# Patient Record
Sex: Female | Born: 1990 | Race: Black or African American | Hispanic: No | Marital: Single | Smoking: Never smoker
Health system: Southern US, Community
[De-identification: ages and names within clinical notes are randomized; demographics above are authoritative.]

## PROBLEM LIST (undated history)

## (undated) DIAGNOSIS — K219 Gastro-esophageal reflux disease without esophagitis: Secondary | ICD-10-CM

## (undated) HISTORY — DX: Gastro-esophageal reflux disease without esophagitis: K21.9

## (undated) HISTORY — PX: TONSILLECTOMY: SUR1361

---

## 2012-02-07 ENCOUNTER — Emergency Department (HOSPITAL_COMMUNITY)
Admission: EM | Admit: 2012-02-07 | Discharge: 2012-02-07 | Disposition: A | Discharge status: Home / Self Care | Payer: Medicaid Other | Attending: Emergency Medicine | Admitting: Emergency Medicine

## 2012-02-07 ENCOUNTER — Encounter (HOSPITAL_COMMUNITY): Payer: Self-pay

## 2012-02-07 ENCOUNTER — Emergency Department (HOSPITAL_COMMUNITY): Payer: Medicaid Other

## 2012-02-07 DIAGNOSIS — N760 Acute vaginitis: Secondary | ICD-10-CM | POA: Insufficient documentation

## 2012-02-07 DIAGNOSIS — A499 Bacterial infection, unspecified: Secondary | ICD-10-CM | POA: Insufficient documentation

## 2012-02-07 DIAGNOSIS — O2390 Unspecified genitourinary tract infection in pregnancy, unspecified trimester: Secondary | ICD-10-CM | POA: Insufficient documentation

## 2012-02-07 DIAGNOSIS — O99891 Other specified diseases and conditions complicating pregnancy: Secondary | ICD-10-CM | POA: Insufficient documentation

## 2012-02-07 DIAGNOSIS — R109 Unspecified abdominal pain: Secondary | ICD-10-CM | POA: Insufficient documentation

## 2012-02-07 DIAGNOSIS — B9689 Other specified bacterial agents as the cause of diseases classified elsewhere: Secondary | ICD-10-CM | POA: Insufficient documentation

## 2012-02-07 LAB — CBC WITH DIFFERENTIAL/PLATELET
Basophils Absolute: 0 10*3/uL (ref 0.0–0.1)
Eosinophils Absolute: 0.1 10*3/uL (ref 0.0–0.7)
Eosinophils Relative: 1 % (ref 0–5)
Lymphocytes Relative: 30 % (ref 12–46)
MCH: 23.8 pg — ABNORMAL LOW (ref 26.0–34.0)
MCV: 71 fL — ABNORMAL LOW (ref 78.0–100.0)
Platelets: 324 10*3/uL (ref 150–400)
RDW: 14.3 % (ref 11.5–15.5)
WBC: 6.7 10*3/uL (ref 4.0–10.5)

## 2012-02-07 LAB — COMPREHENSIVE METABOLIC PANEL
ALT: 38 U/L — ABNORMAL HIGH (ref 0–35)
AST: 24 U/L (ref 0–37)
Albumin: 3.5 g/dL (ref 3.5–5.2)
Calcium: 9.5 mg/dL (ref 8.4–10.5)
Sodium: 135 mEq/L (ref 135–145)
Total Protein: 6.8 g/dL (ref 6.0–8.3)

## 2012-02-07 LAB — URINE MICROSCOPIC-ADD ON

## 2012-02-07 LAB — URINALYSIS, ROUTINE W REFLEX MICROSCOPIC
Bilirubin Urine: NEGATIVE
Specific Gravity, Urine: 1.015 (ref 1.005–1.030)
pH: 7 (ref 5.0–8.0)

## 2012-02-07 LAB — WET PREP, GENITAL

## 2012-02-07 LAB — HCG, QUANTITATIVE, PREGNANCY: hCG, Beta Chain, Quant, S: 44205 m[IU]/mL — ABNORMAL HIGH (ref <5)

## 2012-02-07 MED ORDER — METRONIDAZOLE 0.75 % VA GEL: 1.0000 | Freq: Two times a day (BID) | VAGINAL | Status: AC

## 2012-02-07 NOTE — ED Notes (Signed)
Pt reports lower quad ab pain that started last night, last normal period was July. (only lasted 2 days), white vaginal discharge at times. Denies any urinary s/s

## 2012-02-07 NOTE — ED Notes (Signed)
Pelvic cart set up, awaiting pa.

## 2012-02-07 NOTE — ED Provider Notes (Signed)
History     CSN: [id number]  Arrival date & time 02/07/12  1026   First MD Initiated Contact with Patient 02/07/12 1035      Chief Complaint  Patient presents with  . Abdominal Pain    (Consider location/radiation/quality/duration/timing/severity/associated sxs/prior treatment) HPI Comments: patient c/o lower abdominal pain that began on the evening prior to ED arrival.  Describes the pain as "crampy" onset was gradual but states it has persisted.  She also c/o "whitish" vaginal discharge for several days but states it appears similar to her usual vaginal discharge.  States that her last menstrual period was early July and was shorter duration than her usual periods.  She denies change in appetite, fever, vomiting, urinary symptoms or vaginal bleeding.  States she has not taken a home pregnancy test.  Denies previous pregnancies, miscarriages or abortions  Patient is a 21 y.o. female presenting with abdominal pain. The history is provided by the patient.  Abdominal Pain The primary symptoms of the illness include abdominal pain and vaginal discharge. The primary symptoms of the illness do not include fever, fatigue, shortness of breath, nausea, vomiting, diarrhea, hematemesis, hematochezia, dysuria or vaginal bleeding. The current episode started yesterday. The onset of the illness was gradual. The problem has not changed since onset. The abdominal pain began yesterday. The pain came on gradually. The abdominal pain has been unchanged since its onset. The abdominal pain is located in the LLQ and RLQ. The abdominal pain does not radiate. The abdominal pain is relieved by nothing.  The vaginal discharge is not associated with dysuria.   Pregnant now: possibly. The patient has not had a change in bowel habit. Symptoms associated with the illness do not include chills, anorexia, diaphoresis, heartburn, constipation, urgency, hematuria, frequency or back pain. Significant associated medical issues  do not include PUD, GERD, inflammatory bowel disease, diabetes, gallstones or substance abuse.    History reviewed. No pertinent past medical history.  History reviewed. No pertinent past surgical history.  No family history on file.  History  Substance Use Topics  . Smoking status: Never Smoker   . Smokeless tobacco: Not on file  . Alcohol Use: No    OB History    Grav Para Term Preterm Abortions TAB SAB Ect Mult Living                  Review of Systems  Constitutional: Negative for fever, chills, diaphoresis, appetite change and fatigue.  Respiratory: Negative for chest tightness and shortness of breath.   Cardiovascular: Negative for chest pain.  Gastrointestinal: Positive for abdominal pain. Negative for heartburn, nausea, vomiting, diarrhea, constipation, blood in stool, hematochezia, abdominal distention, anorexia and hematemesis.  Genitourinary: Positive for vaginal discharge, menstrual problem and pelvic pain. Negative for dysuria, urgency, frequency, hematuria, flank pain, vaginal bleeding and difficulty urinating.  Musculoskeletal: Negative for back pain.  Skin: Negative for rash.  Neurological: Negative for dizziness and headaches.  All other systems reviewed and are negative.    Allergies  Review of patient's allergies indicates no known allergies.  Home Medications  No current outpatient prescriptions on file.  BP 102/70  Pulse 97  Temp 98.9 F (37.2 C) (Oral)  Resp 20  Ht 5\' 2"  (1.575 m)  Wt 105 lb (47.628 kg)  BMI 19.20 kg/m2  SpO2 100%  LMP 12/09/2011  Physical Exam  Nursing note and vitals reviewed. Constitutional: She is oriented to person, place, and time. She appears well-developed and well-nourished. No distress.  HENT:  Head:  Normocephalic and atraumatic.  Mouth/Throat: Oropharynx is clear and moist.  Cardiovascular: Normal rate, regular rhythm, normal heart sounds and intact distal pulses.   No murmur heard. Pulmonary/Chest: Effort  normal and breath sounds normal.  Abdominal: Soft. Bowel sounds are normal. She exhibits no distension and no mass. There is tenderness in the suprapubic area. There is no rebound, no guarding, no CVA tenderness and no tenderness at McBurney's point.         Mild ttp of the suprapubic, LLQ and RLQ areas.  No guarding or rebound tenderness, no flank tenderness  Genitourinary: Uterus is enlarged. Uterus is not tender. Cervix exhibits no motion tenderness, no discharge and no friability. Right adnexum displays no mass, no tenderness and no fullness. Left adnexum displays no mass, no tenderness and no fullness. No tenderness or bleeding around the vagina. No foreign body around the vagina. Vaginal discharge found.       Thick, white discharge in the vaginal vault.  No bleeding.  Cervical os is closed.  Uterus is mildly enlarged.  Musculoskeletal: She exhibits no edema.  Neurological: She is alert and oriented to person, place, and time. She exhibits normal muscle tone. Coordination normal.  Skin: Skin is warm and dry.    ED Course  Procedures (including critical care time)  Labs Reviewed  CBC WITH DIFFERENTIAL - Abnormal; Notable for the following:    Hemoglobin 11.4 (*)     HCT 34.1 (*)     MCV 71.0 (*)     MCH 23.8 (*)     All other components within normal limits  COMPREHENSIVE METABOLIC PANEL - Abnormal; Notable for the following:    Glucose, Bld 103 (*)     BUN 5 (*)     ALT 38 (*)     Total Bilirubin 0.2 (*)     All other components within normal limits  URINALYSIS, ROUTINE W REFLEX MICROSCOPIC - Abnormal; Notable for the following:    Hgb urine dipstick TRACE (*)     All other components within normal limits  PREGNANCY, URINE - Abnormal; Notable for the following:    Preg Test, Ur POSITIVE (*)     All other components within normal limits  URINE MICROSCOPIC-ADD ON - Abnormal; Notable for the following:    Squamous Epithelial / LPF FEW (*)     Bacteria, UA MANY (*)     All  other components within normal limits  HCG, QUANTITATIVE, PREGNANCY - Abnormal; Notable for the following:    hCG, Beta Chain, Quant, S [id number] (*)     All other components within normal limits  WET PREP, GENITAL - Abnormal; Notable for the following:    Clue Cells Wet Prep HPF POC MANY (*)     WBC, Wet Prep HPF POC MANY (*)     All other components within normal limits  ABO/RH  GC/CHLAMYDIA PROBE AMP, GENITAL    US Ob Comp Less 14 Wks  02/07/2012  *RADIOLOGY REPORT*  Clinical Data: Abdominal pain with positive pregnancy test.  LMP 12/09/2011  OBSTETRIC <14 WK [place] AND TRANSVAGINAL OB US  Technique:  Both transabdominal and transvaginal ultrasound examinations were performed for complete evaluation of the gestation as well as the maternal uterus, adnexal regions, and pelvic cul-de-sac.  Transvaginal technique was performed to assess early pregnancy.  Comparison:  None.  Intrauterine gestational sac:  Visualized/normal in shape. Yolk sac: Visualized and appears normal with a diameter of 5.9 mm Embryo: Seen Cardiac Activity: Seen Heart Rate: 168 bpm  MSD: 2.68 mm  7 w 4 d CRL: 1.09  mm  7 w  1 d             [place] EDC: 09/25/2011  Maternal uterus/adnexae: The left ovary has a normal appearance measuring 2.8 x 1.1 x 2.0 cm.  The right ovary measures 3.5 x 2.8 x 3.3 cm and contains a complex cystic lesion measuring 2.5 x 2.1 x 2.6 cm.  This contains some diffuse low level echoes and some avascular thin septations.  Color Doppler reveals some peripheral flow around this area and this likely represents a hemorrhagic corpus luteum with endometrioma a secondary consideration.  A small amount of simple free fluid is identified adjacent to the right ovary  IMPRESSION: Single living intrauterine pregnancy demonstrating an estimated gestational age by crown-rump length of 7 weeks 1 day with corresponding EDC of 09/25/2011.  This is 1 week 3 days behind expected estimated gestational age by LMP of 8 weeks 4 days and  suggests best dating by today's exam.  Probable right hemorrhagic corpus luteum with endometrioma a secondary consideration.  This can be initially reassessed at the time of anatomy exam.  We would expect a hemorrhagic corpus luteum to demonstrate interval resolution/evolution while an endometrioma would likely show little interval change.   Original Report Authenticated By: Bertha Stakes, M.D.    US Ob Comp Less 14 Wks  02/07/2012  *RADIOLOGY REPORT*  Clinical Data: Abdominal pain with positive pregnancy test.  LMP 12/09/2011  OBSTETRIC <14 WK [place] AND TRANSVAGINAL OB US  Technique:  Both transabdominal and transvaginal ultrasound examinations were performed for complete evaluation of the gestation as well as the maternal uterus, adnexal regions, and pelvic cul-de-sac.  Transvaginal technique was performed to assess early pregnancy.  Comparison:  None.  Intrauterine gestational sac:  Visualized/normal in shape. Yolk sac: Visualized and appears normal with a diameter of 5.9 mm Embryo: Seen Cardiac Activity: Seen Heart Rate: 168 bpm  MSD: 2.68 mm  7 w 4 d CRL: 1.09  mm  7 w  1 d             [place] EDC: 09/25/2011  Maternal uterus/adnexae: The left ovary has a normal appearance measuring 2.8 x 1.1 x 2.0 cm.  The right ovary measures 3.5 x 2.8 x 3.3 cm and contains a complex cystic lesion measuring 2.5 x 2.1 x 2.6 cm.  This contains some diffuse low level echoes and some avascular thin septations.  Color Doppler reveals some peripheral flow around this area and this likely represents a hemorrhagic corpus luteum with endometrioma a secondary consideration.  A small amount of simple free fluid is identified adjacent to the right ovary  IMPRESSION: Single living intrauterine pregnancy demonstrating an estimated gestational age by crown-rump length of 7 weeks 1 day with corresponding EDC of 09/25/2011.  This is 1 week 3 days behind expected estimated gestational age by LMP of 8 weeks 4 days and suggests best dating by  today's exam.  Probable right hemorrhagic corpus luteum with endometrioma a secondary consideration.  This can be initially reassessed at the time of anatomy exam.  We would expect a hemorrhagic corpus luteum to demonstrate interval resolution/evolution while an endometrioma would likely show little interval change.   Original Report Authenticated By: Bertha Stakes, M.D.      Gc and chlamydia culture pending   MDM     Patient is well appearing, vitals stable.  abd is soft, no guarding or rebound tenderness.  Pt  agrees to close f/u with Dr. Emelda Fear.  Pt states she has severe vomiting with flagyl, so I will prescribe the metro-gel instead.     The patient appears reasonably screened and/or stabilized for discharge and I doubt any other medical condition or other [facility] requiring further screening, evaluation, or treatment in the ED at this time prior to discharge.       David Towson L. [place], [place] 02/07/12 1417

## 2012-02-08 LAB — GC/CHLAMYDIA PROBE AMP, GENITAL
Chlamydia, DNA Probe: NEGATIVE
GC Probe Amp, Genital: NEGATIVE

## 2012-02-10 NOTE — ED Provider Notes (Signed)
Medical screening examination/treatment/procedure(s) were performed by non-physician practitioner and as supervising physician I was immediately available for consultation/collaboration.   Shelda Jakes, MD 02/10/12 [phone]

## 2014-01-02 IMAGING — US US OB COMP LESS 14 WK
1 series · 13 of 28 positions shown · non-contrast
Comparison: None.

CLINICAL DATA: Abdominal pain with positive pregnancy test.  LMP
12/09/2011

OBSTETRIC <14 WK US AND TRANSVAGINAL OB US
TECHNIQUE: Both transabdominal and transvaginal ultrasound
examinations were performed for complete evaluation of the
gestation as well as the maternal uterus, adnexal regions, and
pelvic cul-de-sac.  Transvaginal technique was performed to assess
early pregnancy.

[Series 1: us ob comp less 14 wk · 0.11mm/px · 13 of 51 slices shown]
[im 2/51]
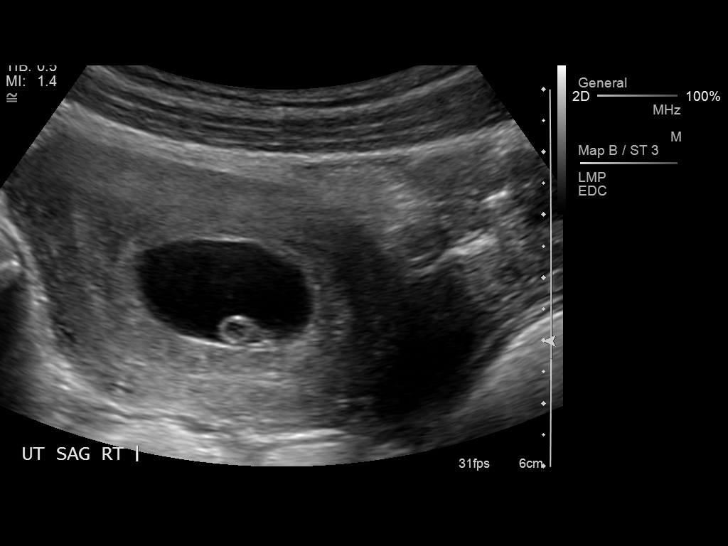
[im 6/51]
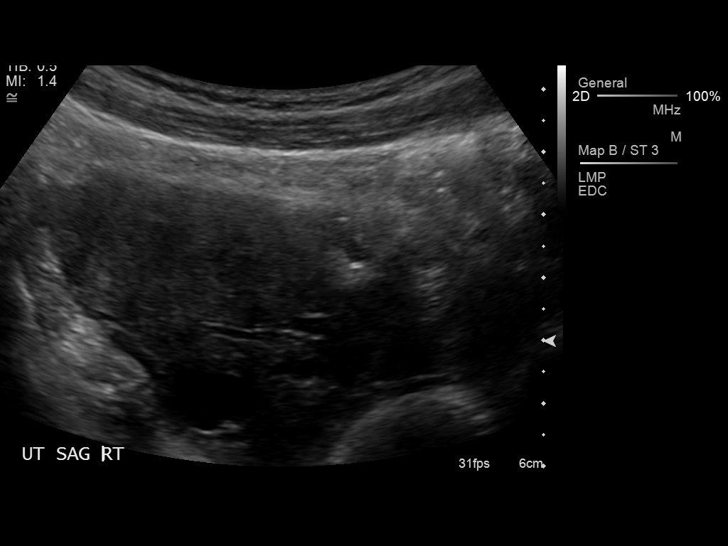
[im 10/51]
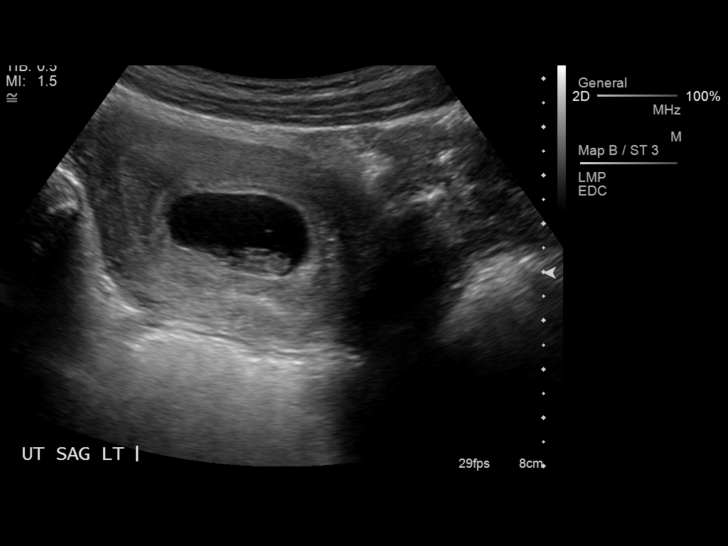
[im 13/51]
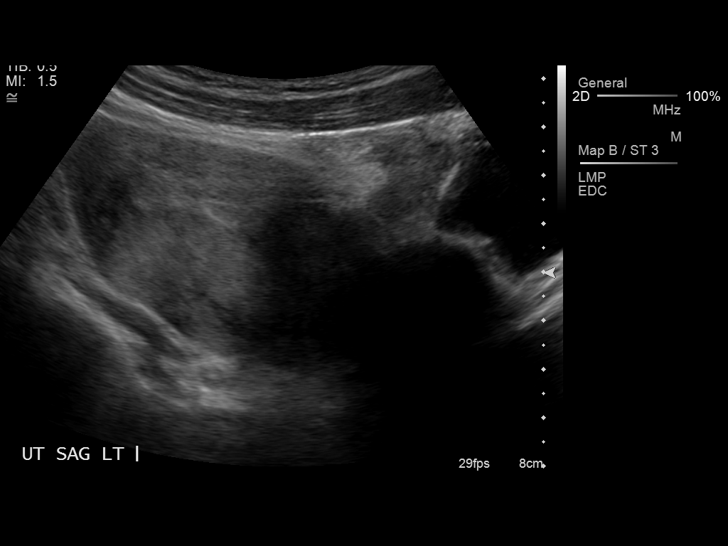
[im 17/51]
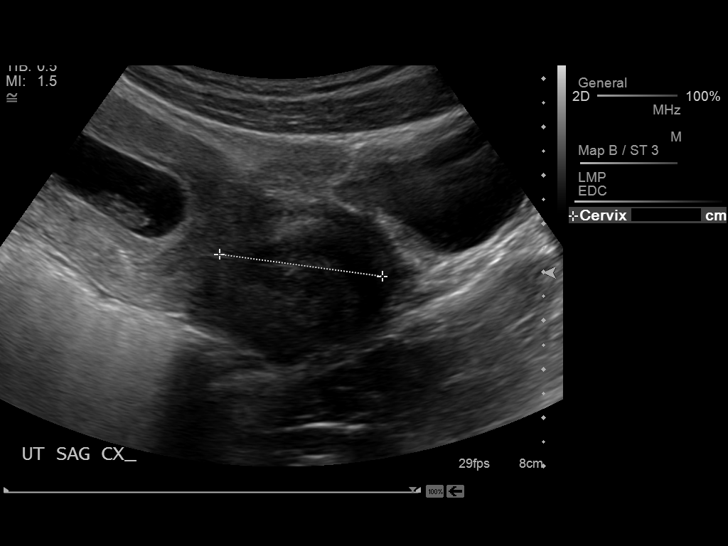
[im 21/51]
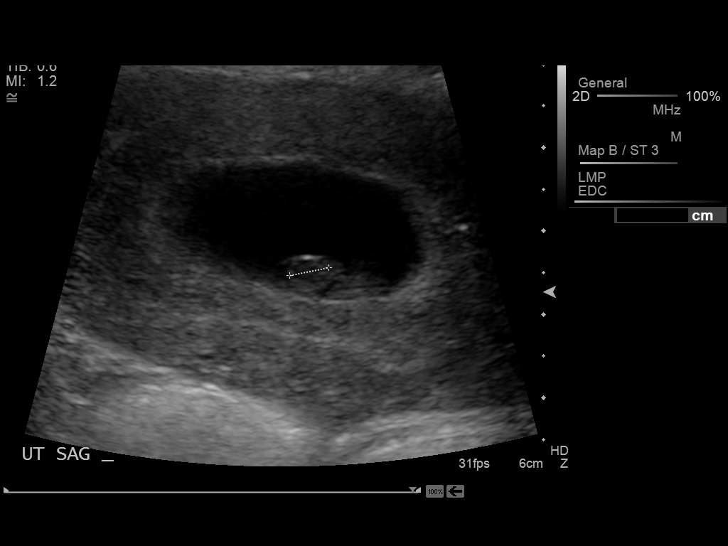
[im 26/51]
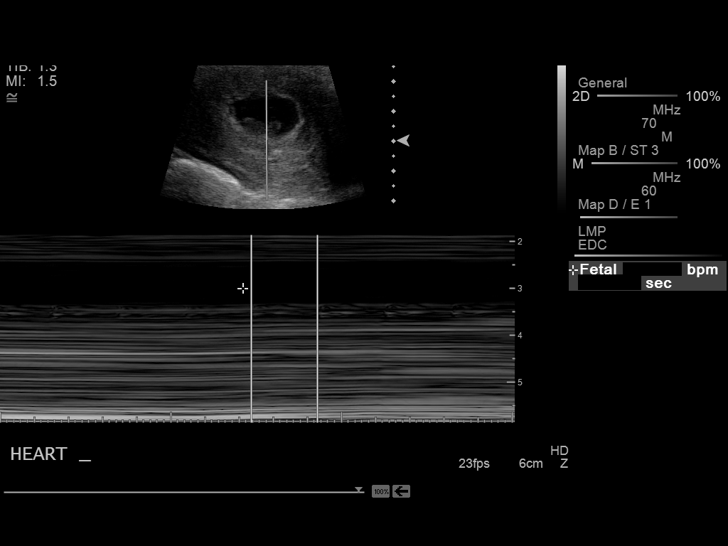
[im 30/51]
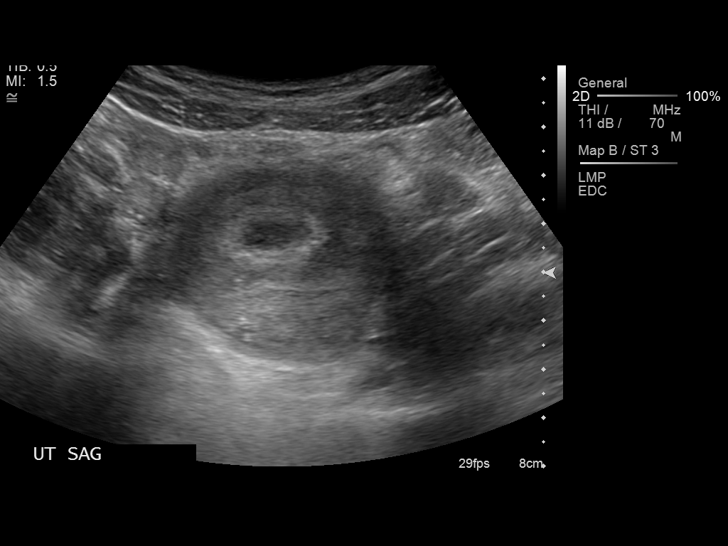
[im 34/51]
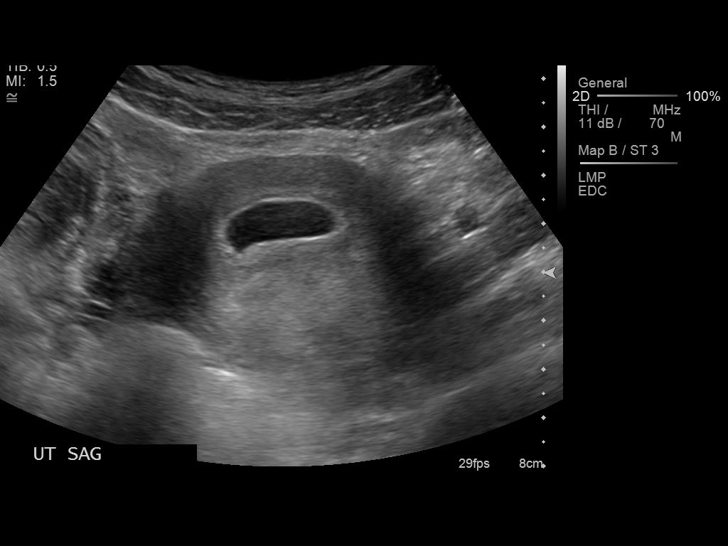
[im 38/51]
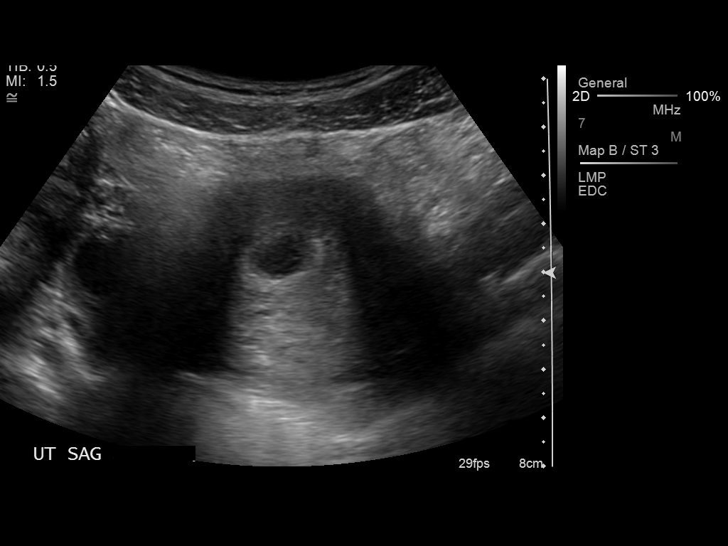
[im 41/51]
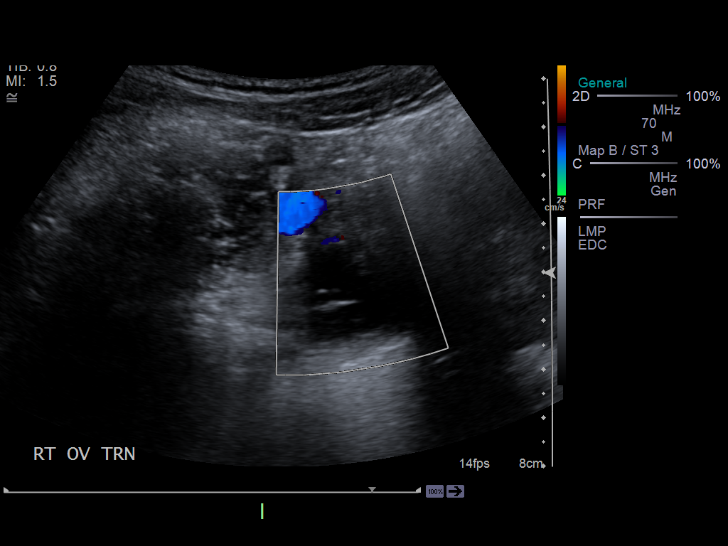
[im 45/51]
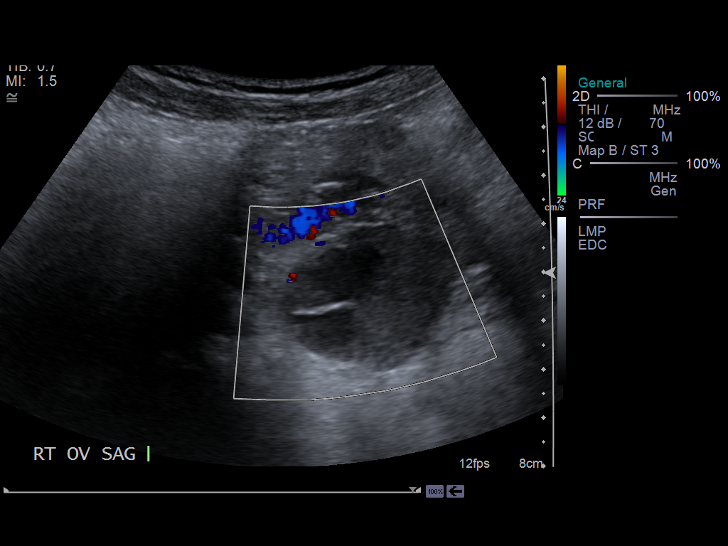
[im 49/51]
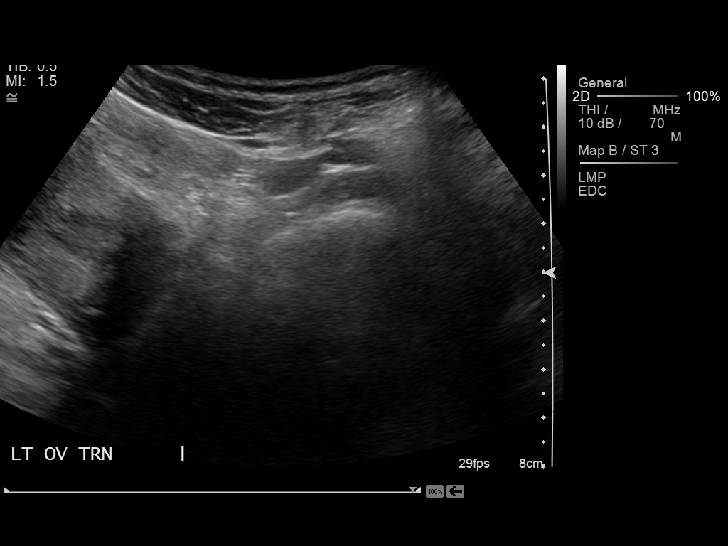

[13 of 28 positions shown; findings below may reference images not displayed]

Intrauterine gestational sac:  Visualized/normal in shape.
Yolk sac: Visualized and appears normal with a diameter of 5.9 mm
Embryo: Seen
Cardiac Activity: Seen
Heart Rate: 168 bpm

MSD: 2.68 mm  7 w 4 d
CRL: 1.09  mm  7 w  1 d             US EDC: 09/25/2011

Maternal uterus/adnexae:
The left ovary has a normal appearance measuring 2.8 x 1.1 x
cm.

The right ovary measures 3.5 x 2.8 x 3.3 cm and contains a complex
cystic lesion measuring 2.5 x 2.1 x 2.6 cm.  This contains some
diffuse low level echoes and some avascular thin septations.  Color
Doppler reveals some peripheral flow around this area and this
likely represents a hemorrhagic corpus luteum with endometrioma a
secondary consideration.

A small amount of simple free fluid is identified adjacent to the
right ovary
IMPRESSION: Single living intrauterine pregnancy demonstrating an estimated
gestational age by crown-rump length of 7 weeks 1 day with
corresponding EDC of 09/25/2011.  This is 1 week 3 days behind
expected estimated gestational age by LMP of 8 weeks 4 days and
suggests best dating by today's exam.

Probable right hemorrhagic corpus luteum with endometrioma a
secondary consideration.  This can be initially reassessed at the
time of anatomy exam.  We would expect a hemorrhagic corpus luteum
to demonstrate interval resolution/evolution while an endometrioma
would likely show little interval change.

## 2014-01-02 IMAGING — US US OB COMP LESS 14 WK
1 series · 13 of 28 positions shown · non-contrast
Comparison: None.

CLINICAL DATA: Abdominal pain with positive pregnancy test.  LMP
12/09/2011

OBSTETRIC <14 WK US AND TRANSVAGINAL OB US
TECHNIQUE: Both transabdominal and transvaginal ultrasound
examinations were performed for complete evaluation of the
gestation as well as the maternal uterus, adnexal regions, and
pelvic cul-de-sac.  Transvaginal technique was performed to assess
early pregnancy.

[Series 1: us ob comp less 14 wk · 0.08mm/px · 13 of 202 slices shown]
[im 8/202]
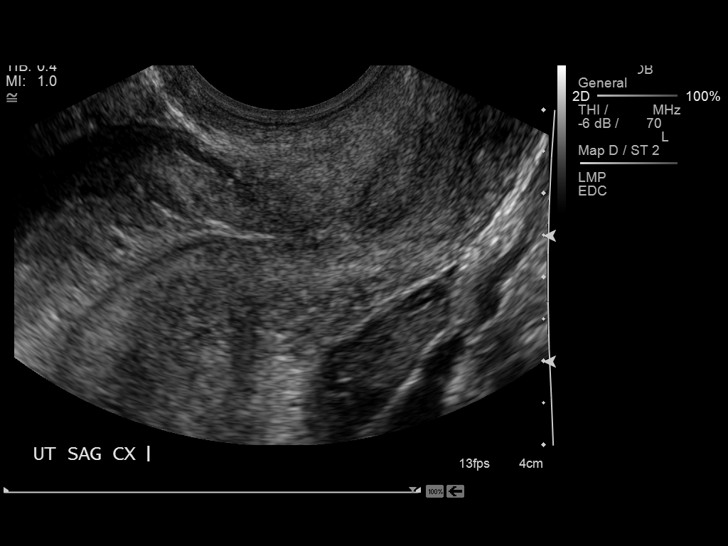
[im 23/202]
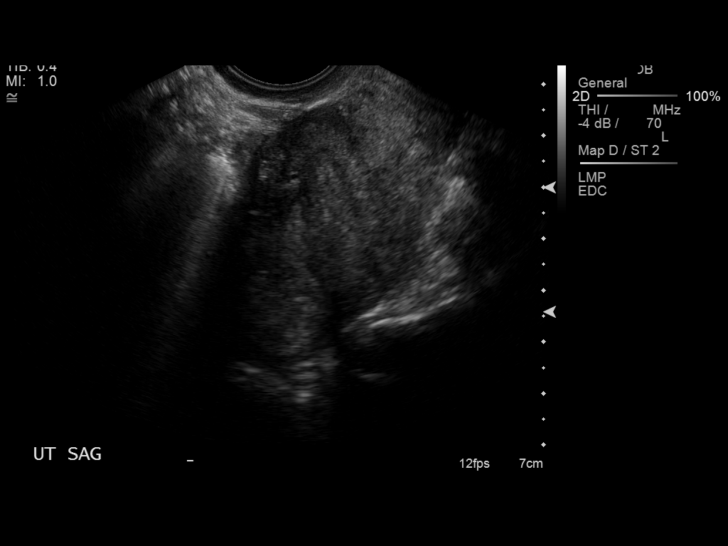
[im 38/202]
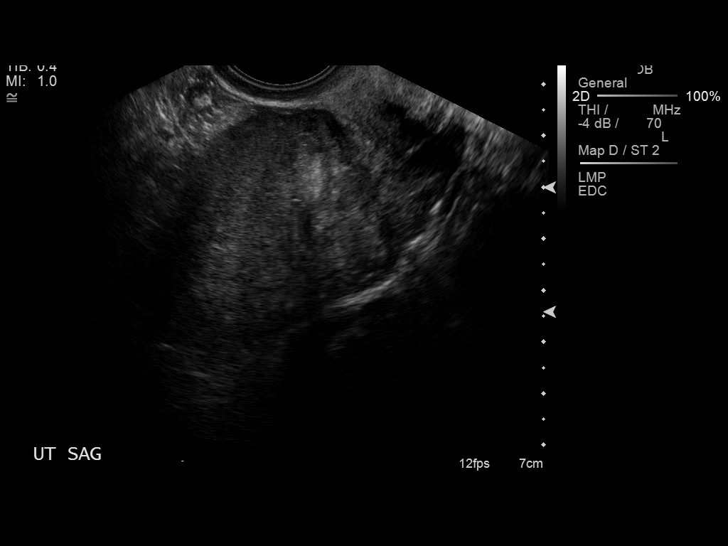
[im 53/202]
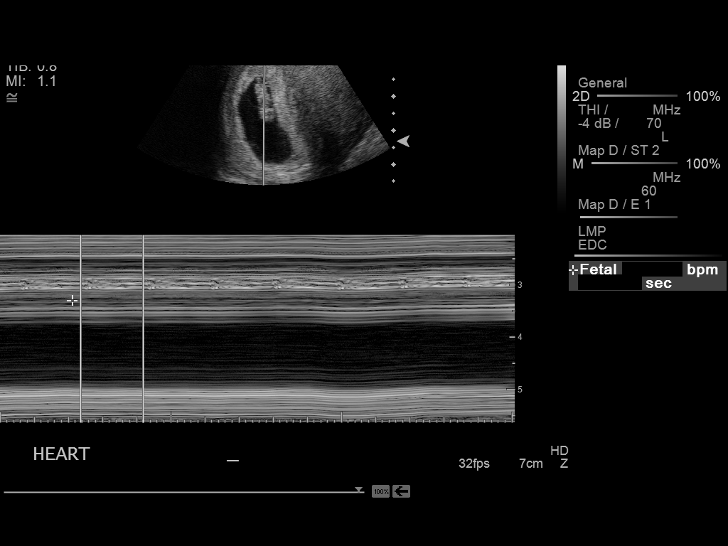
[im 68/202]
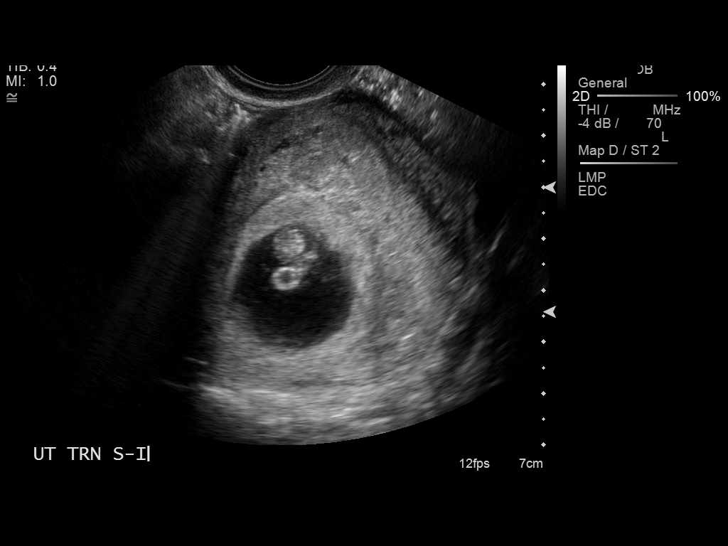
[im 82/202]
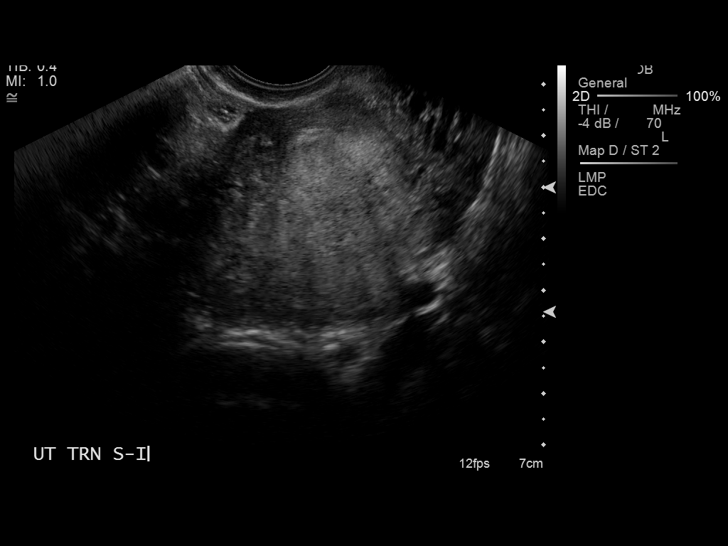
[im 105/202]
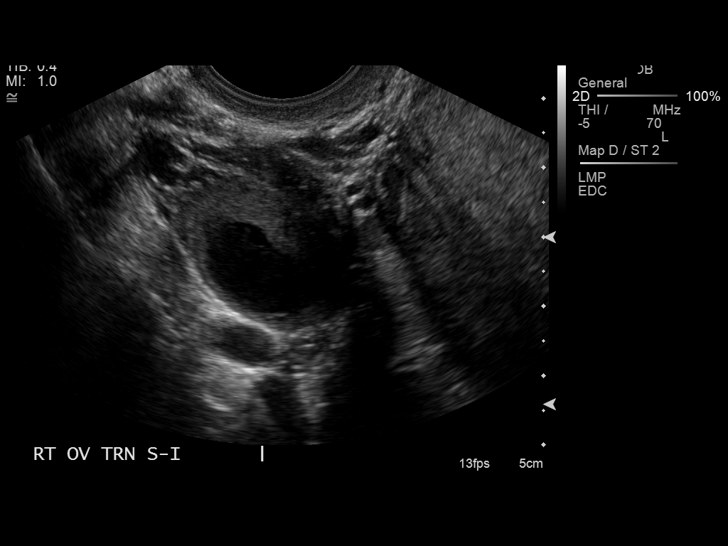
[im 120/202]
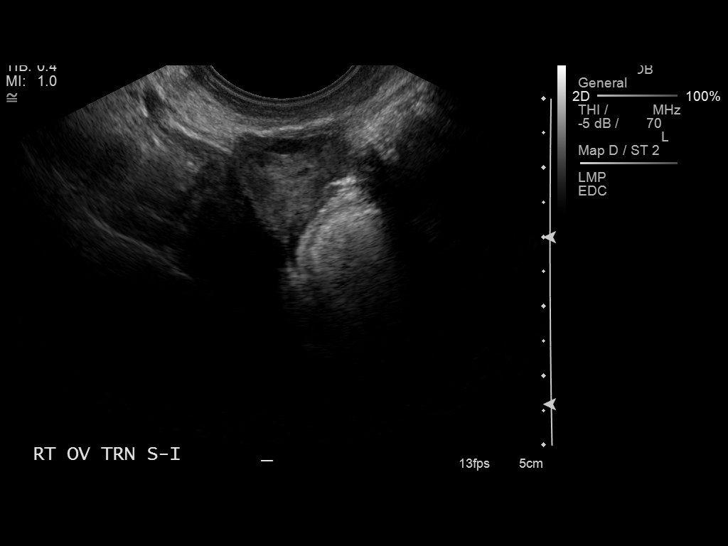
[im 135/202]
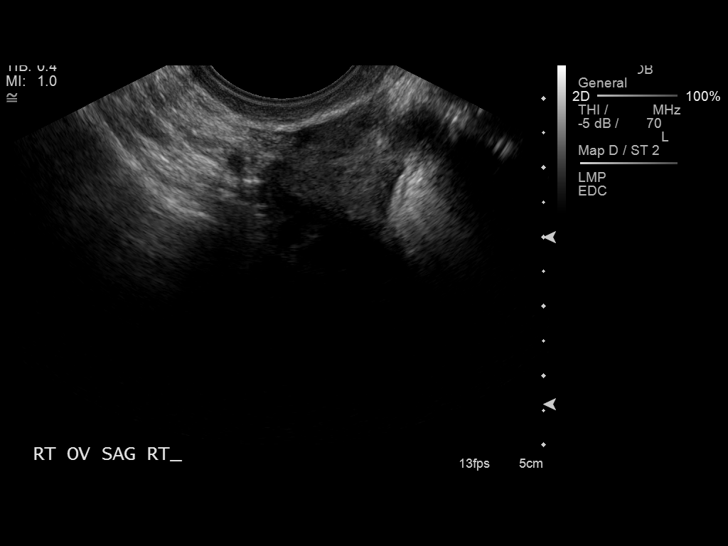
[im 149/202]
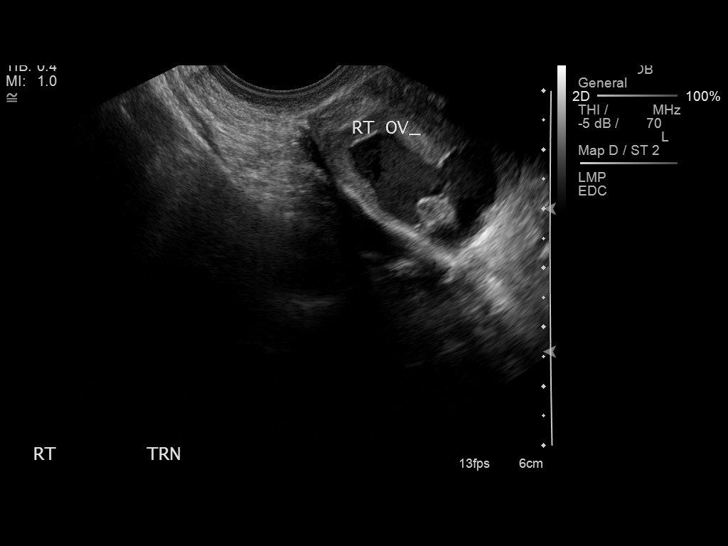
[im 164/202]
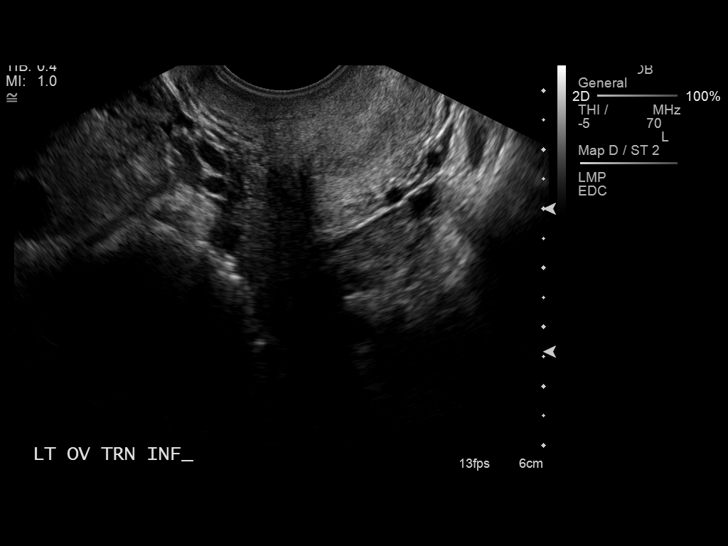
[im 179/202]
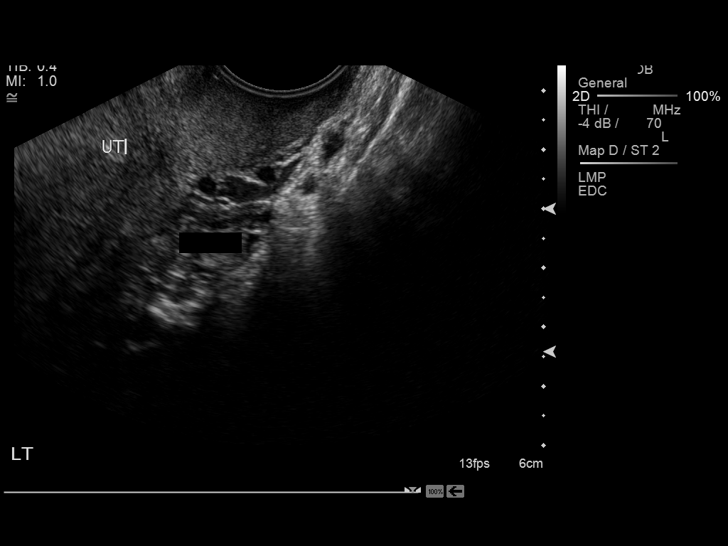
[im 194/202]
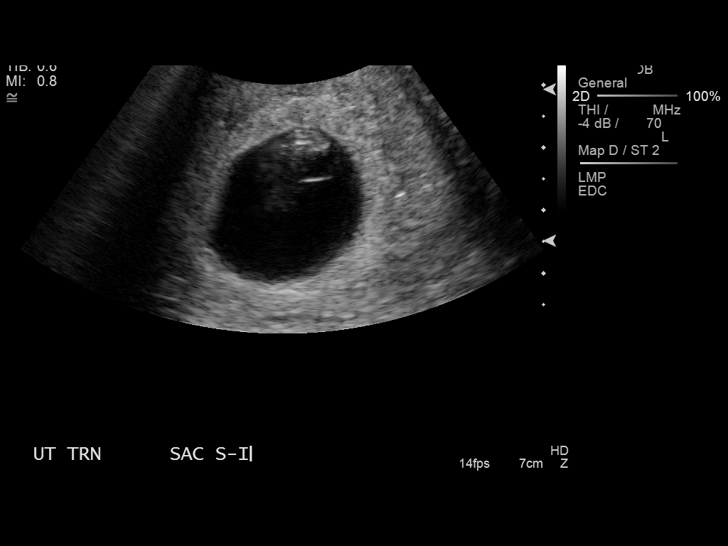

[13 of 28 positions shown; findings below may reference images not displayed]

Intrauterine gestational sac:  Visualized/normal in shape.
Yolk sac: Visualized and appears normal with a diameter of 5.9 mm
Embryo: Seen
Cardiac Activity: Seen
Heart Rate: 168 bpm

MSD: 2.68 mm  7 w 4 d
CRL: 1.09  mm  7 w  1 d             US EDC: 09/25/2011

Maternal uterus/adnexae:
The left ovary has a normal appearance measuring 2.8 x 1.1 x
cm.

The right ovary measures 3.5 x 2.8 x 3.3 cm and contains a complex
cystic lesion measuring 2.5 x 2.1 x 2.6 cm.  This contains some
diffuse low level echoes and some avascular thin septations.  Color
Doppler reveals some peripheral flow around this area and this
likely represents a hemorrhagic corpus luteum with endometrioma a
secondary consideration.

A small amount of simple free fluid is identified adjacent to the
right ovary
IMPRESSION: Single living intrauterine pregnancy demonstrating an estimated
gestational age by crown-rump length of 7 weeks 1 day with
corresponding EDC of 09/25/2011.  This is 1 week 3 days behind
expected estimated gestational age by LMP of 8 weeks 4 days and
suggests best dating by today's exam.

Probable right hemorrhagic corpus luteum with endometrioma a
secondary consideration.  This can be initially reassessed at the
time of anatomy exam.  We would expect a hemorrhagic corpus luteum
to demonstrate interval resolution/evolution while an endometrioma
would likely show little interval change.

## 2020-02-09 ENCOUNTER — Ambulatory Visit: Payer: Self-pay | Admitting: Allergy & Immunology

## 2020-03-15 ENCOUNTER — Ambulatory Visit: Payer: Self-pay | Admitting: Allergy & Immunology

## 2021-03-29 ENCOUNTER — Ambulatory Visit: Payer: Self-pay | Admitting: Urology

## 2021-05-11 ENCOUNTER — Other Ambulatory Visit: Payer: Self-pay

## 2021-05-11 ENCOUNTER — Encounter: Payer: Self-pay | Admitting: Urology

## 2021-05-11 ENCOUNTER — Ambulatory Visit: Payer: 59 | Admitting: Urology

## 2021-05-11 VITALS — BP 102/68 | HR 87

## 2021-05-11 DIAGNOSIS — N3941 Urge incontinence: Secondary | ICD-10-CM | POA: Diagnosis not present

## 2021-05-11 DIAGNOSIS — R350 Frequency of micturition: Secondary | ICD-10-CM

## 2021-05-11 LAB — URINALYSIS, ROUTINE W REFLEX MICROSCOPIC
Bilirubin, UA: NEGATIVE
Glucose, UA: NEGATIVE
Ketones, UA: NEGATIVE
Leukocytes,UA: NEGATIVE
Nitrite, UA: NEGATIVE
RBC, UA: NEGATIVE
Specific Gravity, UA: 1.025 (ref 1.005–1.030)
Urobilinogen, Ur: 1 mg/dL (ref 0.2–1.0)
pH, UA: 6.5 (ref 5.0–7.5)

## 2021-05-11 LAB — BLADDER SCAN AMB NON-IMAGING

## 2021-05-11 MED ORDER — MIRABEGRON ER 50 MG PO TB24: 50.0000 mg | ORAL_TABLET | Freq: Every day | ORAL | 0 refills | Status: AC

## 2021-05-11 NOTE — Progress Notes (Signed)
Urological Symptom Review  Patient is experiencing the following symptoms: Leakage of urine   Review of Systems  Gastrointestinal (upper)  : Indigestion/heartburn  Gastrointestinal (lower) : Negative for lower GI symptoms  Constitutional : Night Sweats  Skin: Negative for skin symptoms  Eyes: Negative for eye symptoms  Ear/Nose/Throat : Sore throat Sinus problems  Hematologic/Lymphatic: Negative for Hematologic/Lymphatic symptoms  Cardiovascular : Leg swelling  Respiratory : Cough  Endocrine: Negative for endocrine symptoms  Musculoskeletal: Back pain  Neurological: Headaches Dizziness  Psychologic: Negative for psychiatric symptoms

## 2021-05-11 NOTE — Progress Notes (Signed)
   Assessment: 1. Urge incontinence     Plan: No evidence of UTI today. Diagnosis and management options for urge incontinence discussed including avoidance of dietary irritants, behavioral modification, medical therapy, neuromodulation, and chemodenervation. Bladder diet discussed.  Trial of Myrbetriq 25 mg daily x2 weeks then increase to 50 mg daily x2 weeks.  Samples provided.  Use and side effects discussed. Return to office in 1 month.  Chief Complaint:  Chief Complaint  Patient presents with   Urinary Incontinence     History of Present Illness:  Angela Cabrera is a 30 y.o. year old female who is seen for evaluation of urinary urgency and urinary incontinence.  She has had urinary symptoms for approximately 1 year.  She reports urinary urgency, nocturia 1-2 times.  She has urinary incontinence, typically associated with urgency.  She is not sure if she empties her bladder completely.  She also has noted a strong odor to her urine.  She has been treated for a UTI x1 without improvement in her symptoms.  She does have intermittent low back pain bilaterally. No dysuria or gross hematuria.   Past Medical History:  Past Medical History:  Diagnosis Date   GERD (gastroesophageal reflux disease)     Past Surgical History:  Past Surgical History:  Procedure Laterality Date   TONSILLECTOMY      Allergies:  No Known Allergies  Family History:  History reviewed. No pertinent family history.  Social History:  Social History   Tobacco Use   Smoking status: Never   Smokeless tobacco: Never  Substance Use Topics   Alcohol use: No   Drug use: No    Review of symptoms:  Constitutional:  Negative for unexplained weight loss, night sweats, fever, chills ENT:  Negative for nose bleeds, sinus pain, painful swallowing CV:  Negative for chest pain, shortness of breath, exercise intolerance, palpitations, loss of consciousness Resp:  Negative for cough, wheezing, shortness of  breath GI:  Negative for nausea, vomiting, diarrhea, bloody stools GU:  Positives noted in HPI; otherwise negative for gross hematuria, dysuria Neuro:  Negative for seizures, poor balance, limb weakness, slurred speech Psych:  Negative for lack of energy, depression, anxiety Endocrine:  Negative for polydipsia, polyuria, symptoms of hypoglycemia (dizziness, hunger, sweating) Hematologic:  Negative for anemia, purpura, petechia, prolonged or excessive bleeding, use of anticoagulants  Allergic:  Negative for difficulty breathing or choking as a result of exposure to anything; no shellfish allergy; no allergic response (rash/itch) to materials, foods  Physical exam: BP 102/68   Pulse 87  GENERAL APPEARANCE:  Well appearing, well developed, well nourished, NAD HEENT: Atraumatic, Normocephalic, oropharynx clear. NECK: Supple without lymphadenopathy or thyromegaly. LUNGS: Clear to auscultation bilaterally. HEART: Regular Rate and Rhythm without murmurs, gallops, or rubs. ABDOMEN: Soft, non-tender, No Masses. EXTREMITIES: Moves all extremities well.  Without clubbing, cyanosis, or edema. NEUROLOGIC:  Alert and oriented x 3, normal gait, CN II-XII grossly intact.  MENTAL STATUS:  Appropriate. BACK:  Non-tender to palpation.  No CVAT SKIN:  Warm, dry and intact.    Results: U/A:  dipstick negative  PVR:  0 ml

## 2021-05-11 NOTE — Progress Notes (Signed)
post void residual = 0 ml

## 2021-06-14 ENCOUNTER — Ambulatory Visit: Payer: 59 | Admitting: Urology

## 2021-06-14 DIAGNOSIS — N3941 Urge incontinence: Secondary | ICD-10-CM

## 2021-06-14 NOTE — Progress Notes (Deleted)
° °  Assessment: 1. Urge incontinence     Plan: No evidence of UTI today. Diagnosis and management options for urge incontinence discussed including avoidance of dietary irritants, behavioral modification, medical therapy, neuromodulation, and chemodenervation. Bladder diet discussed.  Trial of Myrbetriq 25 mg daily x2 weeks then increase to 50 mg daily x2 weeks.  Samples provided.  Use and side effects discussed. Return to office in 1 month.  Chief Complaint:  No chief complaint on file.    History of Present Illness:  Angela Cabrera is a 31 y.o. year old female who is seen for further evaluation of urinary urgency and urinary incontinence.  At her initial visit on 05/11/21, she reported urinary symptoms for approximately 1 year with urinary urgency, nocturia 1-2 times.  She reported urinary incontinence, typically associated with urgency.  She also noted a strong odor to her urine.  She had been treated for a UTI x1 without improvement in her symptoms.  She was having intermittent low back pain bilaterally. No dysuria or gross hematuria. Her urinalysis was unremarkable. She was given a trial of Myrbetriq 50 mg daily.  Portions of the above documentation were copied from a prior visit for review purposes only.   Past Medical History:  Past Medical History:  Diagnosis Date   GERD (gastroesophageal reflux disease)     Past Surgical History:  Past Surgical History:  Procedure Laterality Date   TONSILLECTOMY      Allergies:  No Known Allergies  Family History:  No family history on file.  Social History:  Social History   Tobacco Use   Smoking status: Never   Smokeless tobacco: Never  Substance Use Topics   Alcohol use: No   Drug use: No    ROS: Constitutional:  Negative for fever, chills, weight loss CV: Negative for chest pain, previous MI, hypertension Respiratory:  Negative for shortness of breath, wheezing, sleep apnea, frequent cough GI:  Negative for  nausea, vomiting, bloody stool, GERD  Physical exam: There were no vitals taken for this visit. ***  Results: ***

## 2022-02-13 ENCOUNTER — Ambulatory Visit: Payer: 59 | Admitting: Orthopedic Surgery

## 2022-07-01 ENCOUNTER — Encounter: Payer: 59 | Admitting: Obstetrics and Gynecology
